# Patient Record
Sex: Male | Born: 1980 | Race: White | Hispanic: No | Marital: Single | Smoking: Current every day smoker
Health system: Southern US, Community
[De-identification: ages and names within clinical notes are randomized; demographics above are authoritative.]

## PROBLEM LIST (undated history)

## (undated) DIAGNOSIS — I10 Essential (primary) hypertension: Secondary | ICD-10-CM

---

## 2019-09-10 LAB — POCT LIPID PANEL
HDL: 61
LDL: 87
Non-HDL: 103
POC Glucose: 111 mg/dl — AB (ref 70–99)
TC/HDL: 2.7
TC: 163
TRG: 80

## 2019-09-20 ENCOUNTER — Other Ambulatory Visit: Payer: Self-pay

## 2019-09-20 DIAGNOSIS — Z008 Encounter for other general examination: Secondary | ICD-10-CM

## 2019-09-20 NOTE — Progress Notes (Signed)
     Patient ID: Shaun Heath, male    DOB: Nov 09, 1981, 38 y.o.   MRN: [id number]    Thank you!!  [place] Specialist [place]: [phone]  Cell:  [phone] Website: [email or web address]

## 2020-09-08 ENCOUNTER — Ambulatory Visit (INDEPENDENT_AMBULATORY_CARE_PROVIDER_SITE_OTHER): Payer: BC Managed Care – PPO

## 2020-09-08 ENCOUNTER — Other Ambulatory Visit: Payer: Self-pay

## 2020-09-08 ENCOUNTER — Ambulatory Visit
Admission: EM | Admit: 2020-09-08 | Discharge: 2020-09-08 | Disposition: A | Discharge status: Home / Self Care | Payer: BC Managed Care – PPO | Attending: Family Medicine | Admitting: Family Medicine

## 2020-09-08 DIAGNOSIS — R062 Wheezing: Secondary | ICD-10-CM | POA: Diagnosis not present

## 2020-09-08 DIAGNOSIS — J40 Bronchitis, not specified as acute or chronic: Secondary | ICD-10-CM

## 2020-09-08 DIAGNOSIS — R059 Cough, unspecified: Secondary | ICD-10-CM | POA: Insufficient documentation

## 2020-09-08 DIAGNOSIS — Z20822 Contact with and (suspected) exposure to covid-19: Secondary | ICD-10-CM | POA: Diagnosis not present

## 2020-09-08 DIAGNOSIS — I10 Essential (primary) hypertension: Secondary | ICD-10-CM | POA: Insufficient documentation

## 2020-09-08 DIAGNOSIS — Z79899 Other long term (current) drug therapy: Secondary | ICD-10-CM | POA: Diagnosis not present

## 2020-09-08 DIAGNOSIS — F17210 Nicotine dependence, cigarettes, uncomplicated: Secondary | ICD-10-CM | POA: Diagnosis not present

## 2020-09-08 HISTORY — DX: Essential (primary) hypertension: I10

## 2020-09-08 MED ORDER — BENZONATATE 200 MG PO CAPS: 200.0000 mg | ORAL_CAPSULE | Freq: Three times a day (TID) | ORAL | 0 refills | Status: DC | PRN

## 2020-09-08 MED ORDER — DOXYCYCLINE HYCLATE 100 MG PO CAPS: 100.0000 mg | ORAL_CAPSULE | Freq: Two times a day (BID) | ORAL | 0 refills | Status: DC

## 2020-09-08 NOTE — ED Provider Notes (Signed)
[facility]    CSN: [id number] Arrival date & time: 09/08/20  0813      History   Chief Complaint Chief Complaint  Patient presents with   Wheezing   Cough   HPI  39 year old male presents with the above complaints.  Patient reports ongoing cough, congestion, and wheezing.  Was recently seen by his primary care provider on 10/13.  Was given albuterol.  Patient reports that he seems to be worsening.  Has especially gotten worse since Wednesday of this week.  He reports congestion and productive cough.  Wheezing.  He believes he has a respiratory infection.  No fever.  No relieving factors.  Patient states that his cough is productive of discolored sputum.  No reported sick contacts.  No other associated symptoms.  Past Medical History:  Diagnosis Date   Hypertension    Home Medications    Prior to Admission medications   Medication Sig Start Date End Date Taking? Authorizing Provider  albuterol (VENTOLIN HFA) 108 (90 Base) MCG/ACT inhaler Inhale into the lungs. 08/28/20 08/28/21 Yes [provider]  hydrochlorothiazide (HYDRODIURIL) 25 MG tablet Take by mouth. 05/16/20 08/28/21 Yes [provider]  propranolol (INDERAL) 10 MG tablet Take by mouth. 05/16/20 05/16/21 Yes [provider]  benzonatate (TESSALON) 200 MG capsule Take 1 capsule (200 mg total) by mouth 3 (three) times daily as needed for cough. 09/08/20   Tommie Sams, DO  doxycycline (VIBRAMYCIN) 100 MG capsule Take 1 capsule (100 mg total) by mouth 2 (two) times daily. 09/08/20   Tommie Sams, DO    Family History History reviewed. No pertinent family history.  Social History Social History   Tobacco Use   Smoking status: Current Every Day Smoker    Packs/day: 0.10    Types: Cigarettes   Smokeless tobacco: Never Used  Vaping Use   Vaping Use: Never used  Substance Use Topics   Alcohol use: Yes    Comment: daily 2-3 beers   Drug use: Never     Allergies     Patient has no known allergies.   Review of Systems Review of Systems  Constitutional: Negative for fever.  HENT: Positive for congestion.   Respiratory: Positive for cough and wheezing.    Physical Exam Triage Vital Signs ED Triage Vitals  Enc Vitals Group     BP 09/08/20 0826 (!) 161/111     Pulse Rate 09/08/20 0826 83     Resp 09/08/20 0826 18     Temp 09/08/20 0826 98.3 F (36.8 C)     Temp Source 09/08/20 0826 Oral     SpO2 09/08/20 0826 96 %     Weight 09/08/20 0823 233 lb (105.7 kg)     Height 09/08/20 0823 5\' 6"  (1.676 m)     Head Circumference --      Peak Flow --      Pain Score 09/08/20 0826 2     Pain Loc --      Pain Edu? --      Excl. in GC? --    Updated Vital Signs BP (!) 161/111 (BP Location: Left Arm)    Pulse 83    Temp 98.3 F (36.8 C) (Oral)    Resp 18    Ht 5\' 6"  (1.676 m)    Wt 105.7 kg    SpO2 96%    BMI 37.61 kg/m   Visual Acuity Right Eye Distance:   Left Eye Distance:   Bilateral Distance:  Right Eye Near:   Left Eye Near:    Bilateral Near:     Physical Exam Vitals and nursing note reviewed.  Constitutional:      General: He is not in acute distress.    Appearance: Normal appearance. He is not ill-appearing.  HENT:     Head: Normocephalic and atraumatic.  Eyes:     General:        Right eye: No discharge.        Left eye: No discharge.     Conjunctiva/sclera: Conjunctivae normal.  Cardiovascular:     Rate and Rhythm: Normal rate and regular rhythm.  Pulmonary:     Effort: Pulmonary effort is normal.     Breath sounds: Wheezing present.  Neurological:     Mental Status: He is alert.  Psychiatric:        Mood and Affect: Mood normal.        Behavior: Behavior normal.    UC Treatments / Results  Labs (all labs ordered are listed, but only abnormal results are displayed) Labs Reviewed  SARS CORONAVIRUS 2 (TAT 6-24 HRS)    EKG  Radiology DG Chest 2 View  Result Date: 09/08/2020 CLINICAL DATA:  Cough, wheezing  EXAM: CHEST - 2 VIEW COMPARISON:  None. FINDINGS: The heart size and mediastinal contours are within normal limits. No focal airspace consolidation, pleural effusion, or pneumothorax. The visualized skeletal structures are unremarkable. IMPRESSION: No active cardiopulmonary disease. Electronically Signed   By: Duanne Guess D.O.   On: 09/08/2020 08:56   Procedures Procedures (including critical care time)  Medications Ordered in UC Medications - No data to display  Initial Impression / Assessment and Plan / UC Course  I have reviewed the triage vital signs and the nursing notes.  Pertinent labs & imaging results that were available during my care of the patient were reviewed by me and considered in my medical decision making (see chart for details).    39 year old male presents with bronchitis. Treating with Doxy, tessalon perles. Continue Albuterol.   Final Clinical Impressions(s) / UC Diagnoses   Final diagnoses:  Bronchitis   Discharge Instructions   None    ED Prescriptions    Medication Sig Dispense Auth. Provider   doxycycline (VIBRAMYCIN) 100 MG capsule Take 1 capsule (100 mg total) by mouth 2 (two) times daily. 14 capsule Khianna Blazina G, DO   benzonatate (TESSALON) 200 MG capsule Take 1 capsule (200 mg total) by mouth 3 (three) times daily as needed for cough. 30 capsule Tommie Sams, DO     PDMP not reviewed this encounter.   Everlene Other [place], [place] 09/08/20 [phone]

## 2020-09-08 NOTE — ED Triage Notes (Signed)
Pt states wheezing for past several weeks.  Seen by PCP and was given an inhaler. On Wednesday he started with sore throat and nasal congestion. Coughing up yellow phlegm.

## 2020-09-09 LAB — SARS CORONAVIRUS 2 (TAT 6-24 HRS): SARS Coronavirus 2: NEGATIVE

## 2021-03-19 ENCOUNTER — Other Ambulatory Visit: Payer: Self-pay

## 2021-03-19 ENCOUNTER — Encounter: Payer: Self-pay | Admitting: Emergency Medicine

## 2021-03-19 ENCOUNTER — Ambulatory Visit
Admission: EM | Admit: 2021-03-19 | Discharge: 2021-03-19 | Disposition: A | Discharge status: Home / Self Care | Payer: BC Managed Care – PPO | Attending: Physician Assistant | Admitting: Physician Assistant

## 2021-03-19 DIAGNOSIS — J069 Acute upper respiratory infection, unspecified: Secondary | ICD-10-CM | POA: Diagnosis not present

## 2021-03-19 DIAGNOSIS — F17210 Nicotine dependence, cigarettes, uncomplicated: Secondary | ICD-10-CM | POA: Insufficient documentation

## 2021-03-19 DIAGNOSIS — Z20822 Contact with and (suspected) exposure to covid-19: Secondary | ICD-10-CM | POA: Diagnosis not present

## 2021-03-19 DIAGNOSIS — J029 Acute pharyngitis, unspecified: Secondary | ICD-10-CM | POA: Insufficient documentation

## 2021-03-19 DIAGNOSIS — R0981 Nasal congestion: Secondary | ICD-10-CM

## 2021-03-19 DIAGNOSIS — R059 Cough, unspecified: Secondary | ICD-10-CM

## 2021-03-19 LAB — GROUP A STREP BY PCR: Group A Strep by PCR: NOT DETECTED

## 2021-03-19 MED ORDER — PROMETHAZINE-DM 6.25-15 MG/5ML PO SYRP: 5.0000 mL | ORAL_SOLUTION | Freq: Four times a day (QID) | ORAL | 0 refills | Status: DC | PRN

## 2021-03-19 NOTE — ED Provider Notes (Signed)
[facility]    CSN: [id number] Arrival date & time: 03/19/21  1208      History   Chief Complaint Chief Complaint  Patient presents with  . Cough  . Sore Throat    HPI Shaun Heath is a 40 y.o. male presenting for 3-day history of productive cough, nasal congestion, sore throat and postnasal drainage.  He denies any fever, fatigue, body aches, sinus pain, ear pain, chest pain, shortness of breath, abdominal pain, nausea/vomiting or diarrhea.  No sick contacts and no known exposure to COVID-19.  Patient's been taking over-the-counter Mucinex and using Flonase.  No other concerns.  HPI  Past Medical History:  Diagnosis Date  . Hypertension     There are no problems to display for this patient.   History reviewed. No pertinent surgical history.     Home Medications    Prior to Admission medications   Medication Sig Start Date End Date Taking? Authorizing Provider  azelastine (ASTELIN) 0.1 % nasal spray 1 spray into each nostril Two (2) times a day. 05/30/20  Yes [provider]  promethazine-dextromethorphan (PROMETHAZINE-DM) 6.25-15 MG/5ML syrup Take 5 mLs by mouth 4 (four) times daily as needed for cough. 03/19/21  Yes Eusebio Friendly B, PA-C  albuterol (VENTOLIN HFA) 108 (90 Base) MCG/ACT inhaler Inhale into the lungs. 08/28/20 08/28/21  [provider]  hydrochlorothiazide (HYDRODIURIL) 25 MG tablet Take by mouth. 05/16/20 08/28/21  [provider]  propranolol (INDERAL) 10 MG tablet Take by mouth. 05/16/20 05/16/21  [provider]    Family History History reviewed. No pertinent family history.  Social History Social History   Tobacco Use  . Smoking status: Current Every Day Smoker    Packs/day: 0.10    Types: Cigarettes  . Smokeless tobacco: Never Used  Vaping Use  . Vaping Use: Never used  Substance Use Topics  . Alcohol use: Yes    Comment: daily 2-3 beers  . Drug use: Never     Allergies   Patient  has no known allergies.   Review of Systems Review of Systems  Constitutional: Negative for fatigue and fever.  HENT: Positive for congestion and sore throat. Negative for rhinorrhea, sinus pressure and sinus pain.   Respiratory: Positive for cough. Negative for shortness of breath and wheezing.   Cardiovascular: Negative for chest pain.  Gastrointestinal: Negative for abdominal pain, diarrhea, nausea and vomiting.  Musculoskeletal: Negative for myalgias.  Neurological: Negative for weakness, light-headedness and headaches.  Hematological: Negative for adenopathy.     Physical Exam Triage Vital Signs ED Triage Vitals  Enc Vitals Group     BP 03/19/21 1243 (!) 165/100     Pulse Rate 03/19/21 1243 86     Resp 03/19/21 1243 18     Temp 03/19/21 1243 98.8 F (37.1 C)     Temp Source 03/19/21 1243 Oral     SpO2 03/19/21 1243 97 %     Weight --      Height --      Head Circumference --      Peak Flow --      Pain Score 03/19/21 1241 3     Pain Loc --      Pain Edu? --      Excl. in GC? --    No data found.  Updated Vital Signs BP (!) 165/100 (BP Location: Left Arm)   Pulse 86   Temp 98.8 F (37.1 C) (Oral)   Resp 18   SpO2  97%       Physical Exam Vitals and nursing note reviewed.  Constitutional:      General: He is not in acute distress.    Appearance: Normal appearance. He is well-developed. He is not ill-appearing or diaphoretic.  HENT:     Head: Normocephalic and atraumatic.     Right Ear: Tympanic membrane, ear canal and external ear normal.     Left Ear: Tympanic membrane, ear canal and external ear normal.     Nose: Congestion and rhinorrhea present.     Mouth/Throat:     Mouth: Mucous membranes are moist.     Pharynx: Oropharynx is clear. Uvula midline. Posterior oropharyngeal erythema present. No oropharyngeal exudate.     Tonsils: No tonsillar abscesses.  Eyes:     General: No scleral icterus.       Right eye: No discharge.        Left eye: No  discharge.     Conjunctiva/sclera: Conjunctivae normal.  Neck:     Thyroid: No thyromegaly.     Trachea: No tracheal deviation.  Cardiovascular:     Rate and Rhythm: Normal rate and regular rhythm.     Heart sounds: Normal heart sounds.  Pulmonary:     Effort: Pulmonary effort is normal. No respiratory distress.     Breath sounds: No wheezing, rhonchi or rales.  Musculoskeletal:     Cervical back: Neck supple.  Lymphadenopathy:     Cervical: No cervical adenopathy.  Skin:    General: Skin is warm and dry.     Findings: No rash.  Neurological:     General: No focal deficit present.     Mental Status: He is alert. Mental status is at baseline.     Motor: No weakness.     Gait: Gait normal.  Psychiatric:        Mood and Affect: Mood normal.        Behavior: Behavior normal.        Thought Content: Thought content normal.      UC Treatments / Results  Labs (all labs ordered are listed, but only abnormal results are displayed) Labs Reviewed  GROUP A STREP BY PCR  SARS CORONAVIRUS 2 (TAT 6-24 HRS)    EKG   Radiology No results found.  Procedures Procedures (including critical care time)  Medications Ordered in UC Medications - No data to display  Initial Impression / Assessment and Plan / UC Course  I have reviewed the triage vital signs and the nursing notes.  Pertinent labs & imaging results that were available during my care of the patient were reviewed by me and considered in my medical decision making (see chart for details).   40 year old male presenting for 3-day history of cough, congestion and sore throat.  He is afebrile.  He is overall well-appearing.  He does have nasal congestion and mild rhinorrhea.  Mild posterior pharyngeal erythema.  His chest is clear to auscultation and heart regular rate and rhythm.  Molecular strep negative. COVID obtained. Current CDC guidelines, isolation protocol and ED precautions.   Clinical presentation consistent  with URI.  Advised patient symptoms consistent with viral infection and antibiotics not indicated at this time.  Advised that he should follow back up though if he develops any increased sinus pain, ear pain, worsening cough, chest pain, breathing difficulty, fevers or is not better after another week.  I did send Promethazine DM and encouraged him to increase rest and fluids and continue with the  nasal spray.  Work note given for the next couple days.   Final Clinical Impressions(s) / UC Diagnoses   Final diagnoses:  Viral upper respiratory tract infection  Sore throat  Cough  Nasal congestion     Discharge Instructions     URI/COLD SYMPTOMS: Strep is negative. Your exam today is consistent with a viral illness. Antibiotics are not indicated at this time. Use medications as directed, including cough syrup, nasal saline, and decongestants. Your symptoms should improve over the next few days and resolve within 7-10 days. Increase rest and fluids. F/u if symptoms worsen or predominate such as sore throat, ear pain, productive cough, shortness of breath, or if you develop high fevers or worsening fatigue over the next several days.    You have received COVID testing today either for positive exposure, concerning symptoms that could be related to COVID infection, screening purposes, or re-testing after confirmed positive.  Your test obtained today checks for active viral infection in the last 1-2 weeks. If your test is negative now, you can still test positive later. So, if you do develop symptoms you should either get re-tested and/or isolate x 5 days and then strict mask use x 5 days (unvaccinated) or mask use x 10 days (vaccinated). Please follow CDC guidelines.  While Rapid antigen tests come back in 15-20 minutes, send out PCR/molecular test results typically come back within 1-3 days. In the mean time, if you are symptomatic, assume this could be a positive test and treat/monitor yourself as if  you do have COVID.   We will call with test results if positive. Please download the MyChart app and set up a profile to access test results.   If symptomatic, go home and rest. Push fluids. Take Tylenol as needed for discomfort. Gargle warm salt water. Throat lozenges. Take Mucinex DM or Robitussin for cough. Humidifier in bedroom to ease coughing. Warm showers. Also review the COVID handout for more information.  COVID-19 INFECTION: The incubation period of COVID-19 is approximately 14 days after exposure, with most symptoms developing in roughly 4-5 days. Symptoms may range in severity from mild to critically severe. Roughly 80% of those infected will have mild symptoms. People of any age may become infected with COVID-19 and have the ability to transmit the virus. The most common symptoms include: fever, fatigue, cough, body aches, headaches, sore throat, nasal congestion, shortness of breath, nausea, vomiting, diarrhea, changes in smell and/or taste.    COURSE OF ILLNESS Some patients may begin with mild disease which can progress quickly into critical symptoms. If your symptoms are worsening please call ahead to the Emergency Department and proceed there for further treatment. Recovery time appears to be roughly 1-2 weeks for mild symptoms and 3-6 weeks for severe disease.   GO IMMEDIATELY TO ER FOR FEVER YOU ARE UNABLE TO GET DOWN WITH TYLENOL, BREATHING PROBLEMS, CHEST PAIN, FATIGUE, LETHARGY, INABILITY TO EAT OR DRINK, ETC  QUARANTINE AND ISOLATION: To help decrease the spread of COVID-19 please remain isolated if you have COVID infection or are highly suspected to have COVID infection. This means -stay home and isolate to one room in the home if you live with others. Do not share a bed or bathroom with others while ill, sanitize and wipe down all countertops and keep common areas clean and disinfected. Stay home for 5 days. If you have no symptoms or your symptoms are resolving after 5 days,  you can leave your house. Continue to wear a mask around  others for 5 additional days. If you have been in close contact (within 6 feet) of someone diagnosed with COVID 19, you are advised to quarantine in your home for 14 days as symptoms can develop anywhere from 2-14 days after exposure to the virus. If you develop symptoms, you  must isolate.  Most current guidelines for COVID after exposure -unvaccinated: isolate 5 days and strict mask use x 5 days. Test on day 5 is possible -vaccinated: wear mask x 10 days if symptoms do not develop -You do not necessarily need to be tested for COVID if you have + exposure and  develop symptoms. Just isolate at home x10 days from symptom onset During this global pandemic, CDC advises to practice social distancing, try to stay at least 156ft away from others at all times. Wear a face covering. Wash and sanitize your hands regularly and avoid going anywhere that is not necessary.  KEEP IN MIND THAT THE COVID TEST IS NOT 100% ACCURATE AND YOU SHOULD STILL DO EVERYTHING TO PREVENT POTENTIAL SPREAD OF VIRUS TO OTHERS (WEAR MASK, WEAR GLOVES, WASH HANDS AND SANITIZE REGULARLY). IF INITIAL TEST IS NEGATIVE, THIS MAY NOT MEAN YOU ARE DEFINITELY NEGATIVE. MOST ACCURATE TESTING IS DONE 5-7 DAYS AFTER EXPOSURE.   It is not advised by CDC to get re-tested after receiving a positive COVID test since you can still test positive for weeks to months after you have already cleared the virus.   *If you have not been vaccinated for COVID, I strongly suggest you consider getting vaccinated as long as there are no contraindications.      ED Prescriptions    Medication Sig Dispense Auth. Provider   promethazine-dextromethorphan (PROMETHAZINE-DM) 6.25-15 MG/5ML syrup Take 5 mLs by mouth 4 (four) times daily as needed for cough. 118 mL Shirlee LatchEaves, Chantell Kunkler B, PA-C     PDMP not reviewed this encounter.   Shirlee Latchaves, Titania Gault B, PA-C 03/19/21 1341

## 2021-03-19 NOTE — Discharge Instructions (Signed)
URI/COLD SYMPTOMS: Strep is negative. Your exam today is consistent with a viral illness. Antibiotics are not indicated at this time. Use medications as directed, including cough syrup, nasal saline, and decongestants. Your symptoms should improve over the next few days and resolve within 7-10 days. Increase rest and fluids. F/u if symptoms worsen or predominate such as sore throat, ear pain, productive cough, shortness of breath, or if you develop high fevers or worsening fatigue over the next several days.    You have received COVID testing today either for positive exposure, concerning symptoms that could be related to COVID infection, screening purposes, or re-testing after confirmed positive.  Your test obtained today checks for active viral infection in the last 1-2 weeks. If your test is negative now, you can still test positive later. So, if you do develop symptoms you should either get re-tested and/or isolate x 5 days and then strict mask use x 5 days (unvaccinated) or mask use x 10 days (vaccinated). Please follow CDC guidelines.  While Rapid antigen tests come back in 15-20 minutes, send out PCR/molecular test results typically come back within 1-3 days. In the mean time, if you are symptomatic, assume this could be a positive test and treat/monitor yourself as if you do have COVID.   We will call with test results if positive. Please download the MyChart app and set up a profile to access test results.   If symptomatic, go home and rest. Push fluids. Take Tylenol as needed for discomfort. Gargle warm salt water. Throat lozenges. Take Mucinex DM or Robitussin for cough. Humidifier in bedroom to ease coughing. Warm showers. Also review the COVID handout for more information.  COVID-19 INFECTION: The incubation period of COVID-19 is approximately 14 days after exposure, with most symptoms developing in roughly 4-5 days. Symptoms may range in severity from mild to critically severe. Roughly 80%  of those infected will have mild symptoms. People of any age may become infected with COVID-19 and have the ability to transmit the virus. The most common symptoms include: fever, fatigue, cough, body aches, headaches, sore throat, nasal congestion, shortness of breath, nausea, vomiting, diarrhea, changes in smell and/or taste.    COURSE OF ILLNESS Some patients may begin with mild disease which can progress quickly into critical symptoms. If your symptoms are worsening please call ahead to the Emergency Department and proceed there for further treatment. Recovery time appears to be roughly 1-2 weeks for mild symptoms and 3-6 weeks for severe disease.   GO IMMEDIATELY TO ER FOR FEVER YOU ARE UNABLE TO GET DOWN WITH TYLENOL, BREATHING PROBLEMS, CHEST PAIN, FATIGUE, LETHARGY, INABILITY TO EAT OR DRINK, ETC  QUARANTINE AND ISOLATION: To help decrease the spread of COVID-19 please remain isolated if you have COVID infection or are highly suspected to have COVID infection. This means -stay home and isolate to one room in the home if you live with others. Do not share a bed or bathroom with others while ill, sanitize and wipe down all countertops and keep common areas clean and disinfected. Stay home for 5 days. If you have no symptoms or your symptoms are resolving after 5 days, you can leave your house. Continue to wear a mask around others for 5 additional days. If you have been in close contact (within 6 feet) of someone diagnosed with COVID 19, you are advised to quarantine in your home for 14 days as symptoms can develop anywhere from 2-14 days after exposure to the virus. If you develop  symptoms, you  must isolate.  Most current guidelines for COVID after exposure -unvaccinated: isolate 5 days and strict mask use x 5 days. Test on day 5 is possible -vaccinated: wear mask x 10 days if symptoms do not develop -You do not necessarily need to be tested for COVID if you have + exposure and  develop symptoms.  Just isolate at home x10 days from symptom onset During this global pandemic, CDC advises to practice social distancing, try to stay at least 6ft away from others at all times. Wear a face covering. Wash and sanitize your hands regularly and avoid going anywhere that is not necessary.  KEEP IN MIND THAT THE COVID TEST IS NOT 100% ACCURATE AND YOU SHOULD STILL DO EVERYTHING TO PREVENT POTENTIAL SPREAD OF VIRUS TO OTHERS (WEAR MASK, WEAR GLOVES, WASH HANDS AND SANITIZE REGULARLY). IF INITIAL TEST IS NEGATIVE, THIS MAY NOT MEAN YOU ARE DEFINITELY NEGATIVE. MOST ACCURATE TESTING IS DONE 5-7 DAYS AFTER EXPOSURE.   It is not advised by CDC to get re-tested after receiving a positive COVID test since you can still test positive for weeks to months after you have already cleared the virus.   *If you have not been vaccinated for COVID, I strongly suggest you consider getting vaccinated as long as there are no contraindications.   

## 2021-03-19 NOTE — ED Triage Notes (Signed)
Pt is present today with a cough and sore throat. Pt states his sx started Sunday.

## 2021-03-20 LAB — SARS CORONAVIRUS 2 (TAT 6-24 HRS): SARS Coronavirus 2: NEGATIVE

## 2021-09-27 ENCOUNTER — Ambulatory Visit
Admission: EM | Admit: 2021-09-27 | Discharge: 2021-09-27 | Disposition: A | Discharge status: Home / Self Care | Payer: BC Managed Care – PPO | Attending: Emergency Medicine | Admitting: Emergency Medicine

## 2021-09-27 ENCOUNTER — Encounter: Payer: Self-pay | Admitting: Emergency Medicine

## 2021-09-27 ENCOUNTER — Other Ambulatory Visit: Payer: Self-pay

## 2021-09-27 DIAGNOSIS — I10 Essential (primary) hypertension: Secondary | ICD-10-CM | POA: Diagnosis not present

## 2021-09-27 DIAGNOSIS — J019 Acute sinusitis, unspecified: Secondary | ICD-10-CM

## 2021-09-27 MED ORDER — BENZONATATE 200 MG PO CAPS: 200.0000 mg | ORAL_CAPSULE | Freq: Three times a day (TID) | ORAL | 0 refills | Status: DC | PRN

## 2021-09-27 MED ORDER — PREDNISONE 20 MG PO TABS: 40.0000 mg | ORAL_TABLET | Freq: Every day | ORAL | 0 refills | Status: AC

## 2021-09-27 MED ORDER — AMOXICILLIN-POT CLAVULANATE 875-125 MG PO TABS: 1.0000 | ORAL_TABLET | Freq: Two times a day (BID) | ORAL | 0 refills | Status: DC

## 2021-09-27 MED ORDER — AEROCHAMBER PLUS MISC: 2 refills | Status: AC

## 2021-09-27 NOTE — ED Provider Notes (Signed)
HPI  SUBJECTIVE:  Shaun Heath is a 40 y.o. male who presents with 6 days of cough.  It has become productive of yellowish-green mucus starting 2 days ago.  He reports sore throat secondary to the cough, occasional wheezing and postnasal drip.  No fevers, nasal congestion, rhinorrhea, sinus pain or pressure, facial swelling, dental pain, shortness of breath, bodyaches, headaches, nausea, vomiting, diarrhea, abdominal pain, allergy symptoms.  No COVID or flu exposure.  He got the first dose of the COVID-vaccine.  He did not get the flu vaccine.  No antibiotics in the past month.  No antipyretic in the past 6 hours.  He has been taking Mucinex, Flonase and Astelin with improvement in his symptoms.  No aggravating factors.  He states is identical to previous episodes of sinusitis.  No recent decongestions.  He has a past medical history of hypertension and states that he is compliant with his medications.  He states his blood pressure is normally elevated in this range.  He is also a smoker.  No history of diabetes, asthma.  PMD: [facility] [place]    Past Medical History:  Diagnosis Date   Hypertension     History reviewed. No pertinent surgical history.  History reviewed. No pertinent family history.  Social History   Tobacco Use   Smoking status: Every Day    Packs/day: 0.10    Types: Cigarettes   Smokeless tobacco: Never  Vaping Use   Vaping Use: Never used  Substance Use Topics   Alcohol use: Yes    Comment: daily 2-3 beers   Drug use: Never    No current facility-administered medications for this encounter.  Current Outpatient Medications:    amoxicillin-clavulanate (AUGMENTIN) 875-125 MG tablet, Take 1 tablet by mouth 2 (two) times daily. X 7 days, Disp: 14 tablet, Rfl: 0   benzonatate (TESSALON) 200 MG capsule, Take 1 capsule (200 mg total) by mouth 3 (three) times daily as needed for cough., Disp: 30 capsule, Rfl: 0   predniSONE (DELTASONE) 20 MG tablet, Take 2  tablets (40 mg total) by mouth daily with breakfast for 5 days., Disp: 10 tablet, Rfl: 0   Spacer/Aero-Holding Chambers (AEROCHAMBER PLUS) inhaler, Use with inhaler, Disp: 1 each, Rfl: 2   albuterol (VENTOLIN HFA) 108 (90 Base) MCG/ACT inhaler, Inhale into the lungs., Disp: , Rfl:    azelastine (ASTELIN) 0.1 % nasal spray, 1 spray into each nostril Two (2) times a day., Disp: , Rfl:    hydrochlorothiazide (HYDRODIURIL) 25 MG tablet, Take by mouth., Disp: , Rfl:    propranolol (INDERAL) 10 MG tablet, Take by mouth., Disp: , Rfl:   No Known Allergies   ROS  As noted in HPI.   Physical Exam  BP (!) 163/118 (BP Location: Left Arm)   Pulse 91   Temp 98.4 F (36.9 C)   Resp 16   Ht 5\' 6"  (1.676 m)   Wt 105.2 kg   SpO2 98%   BMI 37.45 kg/m   Constitutional: Well developed, well nourished, no acute distress Eyes:  EOMI, conjunctiva normal bilaterally HENT: Normocephalic, atraumatic,mucus membranes moist.  Mucoid nasal congestion.  Normal turbinates.  No maxillary, frontal sinus tenderness.  Positive cobblestoning.  No obvious postnasal drip Respiratory: Normal inspiratory effort, diffuse expiratory wheezing throughout all lung fields Cardiovascular: Normal rate, regular rhythm, no murmurs rubs or gallops GI: nondistended skin: No rash, skin intact Musculoskeletal: no deformities Neurologic: Alert & oriented x 3, no focal neuro deficits Psychiatric: Speech and behavior  appropriate   ED Course   Medications - No data to display  No orders of the defined types were placed in this encounter.   No results found for this or any previous visit (from the past 24 hour(s)). No results found.  ED Clinical Impression  1. Acute non-recurrent sinusitis, unspecified location   2. Essential hypertension      ED Assessment/Plan  1.  Sinusitis/cough.  Saline nasal irrigation, regularly scheduled albuterol with a spacer, 40 mg of prednisone for 5 days, Tessalon, continue Mucinex,  Astelin Flonase.  Wait-and-see prescription of Augmentin.  Went over indications for starting this.  2.  Hypertension.  Patient is completely asymptomatic.  States that he feels fine.  Advised that he needs to follow-up with his PMD to get it under better control.  ER return precautions given  Discussed MDM, treatment plan, and plan for follow-up with patient. Discussed sn/sx that should prompt return to the ED. patient agrees with plan.   Meds ordered this encounter  Medications   amoxicillin-clavulanate (AUGMENTIN) 875-125 MG tablet    Sig: Take 1 tablet by mouth 2 (two) times daily. X 7 days    Dispense:  14 tablet    Refill:  0   Spacer/Aero-Holding Chambers (AEROCHAMBER PLUS) inhaler    Sig: Use with inhaler    Dispense:  1 each    Refill:  2    Please educate patient on use   predniSONE (DELTASONE) 20 MG tablet    Sig: Take 2 tablets (40 mg total) by mouth daily with breakfast for 5 days.    Dispense:  10 tablet    Refill:  0   benzonatate (TESSALON) 200 MG capsule    Sig: Take 1 capsule (200 mg total) by mouth 3 (three) times daily as needed for cough.    Dispense:  30 capsule    Refill:  0      *This clinic note was created using Scientist, clinical (histocompatibility and immunogenetics). Therefore, there may be occasional mistakes despite careful proofreading.  ?    Domenick Gong, MD 09/29/21 [phone]

## 2021-09-27 NOTE — ED Triage Notes (Signed)
Pt c/o coughing green mucous, using mucinex.  Sxs started 5 days ago. Sob during coughing fit. No fever

## 2021-09-27 NOTE — Discharge Instructions (Signed)
regularly scheduled albuterol with a spacer for the next several days.  2 puffs every 4 hours for 2 days, then every 6 hours for 2 days, then as needed.  May back off on the albuterol if you start to feel better sooner., 40 mg of prednisone for 5 days-may discontinue this if it makes you feel bad, Tessalon, continue Mucinex, Astelin Flonase.  Wait-and-see prescription of Augmentin.  I would wait another 4 to 5 days before filling this.  Decrease your salt intake. diet and exercise will lower your blood pressure significantly. It is important to keep your blood pressure under good control, as having a elevated blood pressure for prolonged periods of time significantly increases your risk of stroke, heart attacks, kidney damage, eye damage, and other problems. Measure your blood pressure once a day, preferably at the same time every day. Keep a log of this and bring it to your next doctor's appointment.  Bring your blood pressure cuff as well.  Return here in 2 weeks for blood pressure recheck if you're unable to find a primary care physician by then. Return immediately to the ER if you start having chest pain, headache, problems seeing, problems talking, problems walking, if you feel like you're about to pass out, if you do pass out, if you have a seizure, or for any other concerns.  Go to www.goodrx.com  or www.costplusdrugs.com to look up your medications. This will give you a list of where you can find your prescriptions at the most affordable prices. Or ask the pharmacist what the cash price is, or if they have any other discount programs available to help make your medication more affordable. This can be less expensive than what you would pay with insurance.

## 2021-12-31 ENCOUNTER — Other Ambulatory Visit: Payer: Self-pay

## 2021-12-31 ENCOUNTER — Ambulatory Visit
Admission: EM | Admit: 2021-12-31 | Discharge: 2021-12-31 | Disposition: A | Discharge status: Home / Self Care | Payer: BC Managed Care – PPO | Attending: Emergency Medicine | Admitting: Emergency Medicine

## 2021-12-31 DIAGNOSIS — J0111 Acute recurrent frontal sinusitis: Secondary | ICD-10-CM

## 2021-12-31 DIAGNOSIS — R509 Fever, unspecified: Secondary | ICD-10-CM

## 2021-12-31 DIAGNOSIS — J069 Acute upper respiratory infection, unspecified: Secondary | ICD-10-CM

## 2021-12-31 DIAGNOSIS — R197 Diarrhea, unspecified: Secondary | ICD-10-CM | POA: Diagnosis not present

## 2021-12-31 MED ORDER — AZELASTINE HCL 0.1 % NA SOLN: NASAL | 0 refills | Status: AC

## 2021-12-31 MED ORDER — BENZONATATE 200 MG PO CAPS: 200.0000 mg | ORAL_CAPSULE | Freq: Three times a day (TID) | ORAL | 0 refills | Status: AC | PRN

## 2021-12-31 MED ORDER — AMOXICILLIN-POT CLAVULANATE 875-125 MG PO TABS: 1.0000 | ORAL_TABLET | Freq: Two times a day (BID) | ORAL | 0 refills | Status: DC

## 2021-12-31 NOTE — ED Provider Notes (Signed)
[facility]    CSN: [id number] Arrival date & time: 12/31/21  6160      History   Chief Complaint Chief Complaint  Patient presents with   Cough    HPI Shaun Heath is a 41 y.o. male.   Patient presents today with cough congestion some diarrhea and sore throat for 5 days.  Patient states that the entire household has the same thing for him first he thought it was viral.  His symptoms seem to not be going away.  Patient states he had the same symptoms back in November and was given amoxicillin and would like to same treatment.  Patient also states that he has had a low-grade fever took Tylenol and Mucinex last night with minimal relief.  Patient does take 2 nasal sprays with a slight relief and is asking for a refill on them.  Denies any chest pain no shortness of breath.   Past Medical History:  Diagnosis Date   Hypertension     There are no problems to display for this patient.   History reviewed. No pertinent surgical history.     Home Medications    Prior to Admission medications   Medication Sig Start Date End Date Taking? Authorizing Provider  albuterol (VENTOLIN HFA) 108 (90 Base) MCG/ACT inhaler Inhale into the lungs. 08/28/20 12/31/21 Yes [provider]  azelastine (ASTELIN) 0.1 % nasal spray 1 spray into each nostril Two (2) times a day. 05/30/20  Yes [provider]  hydrochlorothiazide (HYDRODIURIL) 25 MG tablet Take by mouth. 05/16/20 12/31/21 Yes [provider]  propranolol (INDERAL) 10 MG tablet Take by mouth. 05/16/20 12/31/21 Yes [provider]  amoxicillin-clavulanate (AUGMENTIN) 875-125 MG tablet Take 1 tablet by mouth 2 (two) times daily. X 7 days 09/27/21   Domenick Gong, MD  benzonatate (TESSALON) 200 MG capsule Take 1 capsule (200 mg total) by mouth 3 (three) times daily as needed for cough. 09/27/21   Domenick Gong, MD  Spacer/Aero-Holding Chambers (AEROCHAMBER PLUS) inhaler Use with inhaler  09/27/21   Domenick Gong, MD    Family History History reviewed. No pertinent family history.  Social History Social History   Tobacco Use   Smoking status: Every Day    Packs/day: 0.10    Types: Cigarettes   Smokeless tobacco: Never  Vaping Use   Vaping Use: Never used  Substance Use Topics   Alcohol use: Yes    Comment: daily 2-3 beers   Drug use: Never     Allergies   Patient has no known allergies.   Review of Systems Review of Systems  Constitutional:  Positive for fever. Negative for activity change, appetite change, chills and fatigue.  HENT:  Positive for congestion, postnasal drip, sinus pressure, sinus pain, sneezing and sore throat.   Eyes: Negative.   Respiratory:  Positive for cough. Negative for shortness of breath and wheezing.   Cardiovascular: Negative.   Gastrointestinal:  Positive for diarrhea. Negative for abdominal pain, nausea and vomiting.  Genitourinary: Negative.   Musculoskeletal: Negative.   Skin: Negative.  Negative for rash.  Neurological: Negative.  Negative for dizziness and headaches.    Physical Exam Triage Vital Signs ED Triage Vitals  Enc Vitals Group     BP 12/31/21 0822 (!) 138/94     Pulse Rate 12/31/21 0822 96     Resp 12/31/21 0822 18     Temp 12/31/21 0822 99.5 F (37.5 C)     Temp Source 12/31/21 7371 Oral  SpO2 12/31/21 0822 96 %     Weight 12/31/21 0821 236 lb (107 kg)     Height 12/31/21 0821 5\' 7"  (1.702 m)     Head Circumference --      Peak Flow --      Pain Score 12/31/21 0820 0     Pain Loc --      Pain Edu? --      Excl. in [place]? --    No data found.  Updated Vital Signs BP (!) 138/94 (BP Location: Left Arm)    Pulse 96    Temp 99.5 F (37.5 C) (Oral)    Resp 18    Ht 5\' 7"  (1.702 m)    Wt 236 lb (107 kg)    SpO2 96%    BMI 36.96 kg/m   Visual Acuity Right Eye Distance:   Left Eye Distance:   Bilateral Distance:    Right Eye Near:   Left Eye Near:    Bilateral Near:     Physical  Exam Constitutional:      Appearance: Normal appearance. He is obese.  HENT:     Right Ear: Tympanic membrane normal.     Left Ear: Tympanic membrane normal.     Nose: Congestion present.     Mouth/Throat:     Mouth: Mucous membranes are moist.     Pharynx: Posterior oropharyngeal erythema present.  Eyes:     Pupils: Pupils are equal, round, and reactive to light.  Cardiovascular:     Rate and Rhythm: Normal rate.  Pulmonary:     Effort: Pulmonary effort is normal.     Breath sounds: Normal breath sounds.  Abdominal:     General: Abdomen is flat. Bowel sounds are normal.  Musculoskeletal:        General: Normal range of motion.     Cervical back: Normal range of motion.  Skin:    General: Skin is warm.     Capillary Refill: Capillary refill takes less than 2 seconds.  Neurological:     General: No focal deficit present.     Mental Status: He is alert.     UC Treatments / Results  Labs (all labs ordered are listed, but only abnormal results are displayed) Labs Reviewed - No data to display  EKG   Radiology No results found.  Procedures Procedures (including critical care time)  Medications Ordered in UC Medications - No data to display  Initial Impression / Assessment and Plan / UC Course  I have reviewed the triage vital signs and the nursing notes.  Pertinent labs & imaging results that were available during my care of the patient were reviewed by me and considered in my medical decision making (see chart for details).     Continue to use your nasal sprays as needed Take cough medicine Tylenol or Motrin as needed Use a humidifier at night to decrease with cough Stay hydrated well with clear fluids If symptoms become worse we would need to return or see your PCP Take Claritin daily Final Clinical Impressions(s) / UC Diagnoses   Final diagnoses:  None   Discharge Instructions   None    ED Prescriptions   None    PDMP not reviewed this  encounter.   Marney Setting, NP 12/31/21 [phone]

## 2021-12-31 NOTE — ED Triage Notes (Signed)
Pt here with C/O cough and green phlegm. States he can taste infection when he coughs phlegm up. For 5 days.

## 2022-01-05 ENCOUNTER — Ambulatory Visit (INDEPENDENT_AMBULATORY_CARE_PROVIDER_SITE_OTHER): Payer: BC Managed Care – PPO

## 2022-01-05 ENCOUNTER — Other Ambulatory Visit: Payer: Self-pay

## 2022-01-05 ENCOUNTER — Ambulatory Visit
Admission: EM | Admit: 2022-01-05 | Discharge: 2022-01-05 | Disposition: A | Discharge status: Home / Self Care | Payer: BC Managed Care – PPO | Attending: Emergency Medicine | Admitting: Emergency Medicine

## 2022-01-05 DIAGNOSIS — M25532 Pain in left wrist: Secondary | ICD-10-CM | POA: Diagnosis not present

## 2022-01-05 MED ORDER — PREDNISONE 10 MG (21) PO TBPK: ORAL_TABLET | ORAL | 0 refills | Status: AC

## 2022-01-05 NOTE — ED Provider Notes (Signed)
[facility]    CSN: [id number] Arrival date & time: 01/05/22  0831      History   Chief Complaint Chief Complaint  Patient presents with   Wrist Pain    HPI Zacharey Jensen is a 41 y.o. male.   HPI  41 year old male here for evaluation of left wrist pain.  Patient reports that he had initial wrist injury approximately a year ago.  He states that he was at his workstation and he spent a lot of time leaning on his left wrist.  This caused pain and inflammation of his wrist that eventually resolved on its own.  He was never evaluated at that time.  He reports that he had a flare a month ago that lasted for several days and then he developed acute pain 2 days ago after trying to open a jar.  His pain is located only in his wrist and the wrist is swollen.  He has been wearing a brace on his wrist.  He states that he has significant amount of pain and limited range of motion due to the pain and swelling.  He denies any numbness or tingling in his fingers.  Past Medical History:  Diagnosis Date   Hypertension     There are no problems to display for this patient.   History reviewed. No pertinent surgical history.     Home Medications    Prior to Admission medications   Medication Sig Start Date End Date Taking? Authorizing Provider  azelastine (ASTELIN) 0.1 % nasal spray 1 spray into each nostril Two (2) times a day. 12/31/21  Yes Coralyn Mark, NP  benzonatate (TESSALON) 200 MG capsule Take 1 capsule (200 mg total) by mouth 3 (three) times daily as needed for cough. 12/31/21  Yes Coralyn Mark, NP  hydrochlorothiazide (HYDRODIURIL) 25 MG tablet Take by mouth. 05/16/20 01/05/22 Yes [provider]  predniSONE (STERAPRED UNI-PAK 21 TAB) 10 MG (21) TBPK tablet Take 6 tablets on day 1, 5 tablets day 2, 4 tablets day 3, 3 tablets day 4, 2 tablets day 5, 1 tablet day 6 01/05/22  Yes Becky Augusta, NP  propranolol (INDERAL) 10 MG tablet Take by mouth.  05/16/20 01/05/22 Yes [provider]  Spacer/Aero-Holding Chambers (AEROCHAMBER PLUS) inhaler Use with inhaler 09/27/21  Yes Domenick Gong, MD  albuterol (VENTOLIN HFA) 108 (90 Base) MCG/ACT inhaler Inhale into the lungs. 08/28/20 12/31/21  [provider]    Family History History reviewed. No pertinent family history.  Social History Social History   Tobacco Use   Smoking status: Every Day    Packs/day: 0.10    Types: Cigarettes   Smokeless tobacco: Never  Vaping Use   Vaping Use: Never used  Substance Use Topics   Alcohol use: Yes    Comment: daily 2-3 beers   Drug use: Never     Allergies   Patient has no known allergies.   Review of Systems Review of Systems  Constitutional:  Negative for fever.  Musculoskeletal:  Positive for arthralgias, joint swelling and myalgias.  Neurological:  Positive for weakness. Negative for numbness.  Hematological: Negative.     Physical Exam Triage Vital Signs ED Triage Vitals  Enc Vitals Group     BP 01/05/22 0906 (!) 146/100     Pulse Rate 01/05/22 0906 (!) 103     Resp 01/05/22 0906 18     Temp 01/05/22 0906 98.4 F (36.9 C)     Temp Source 01/05/22 0906  Oral     SpO2 01/05/22 0906 99 %     Weight 01/05/22 0908 235 lb (106.6 kg)     Height 01/05/22 0908 5\' 7"  (1.702 m)     Head Circumference --      Peak Flow --      Pain Score 01/05/22 0905 8     Pain Loc --      Pain Edu? --      Excl. in [place]? --    No data found.  Updated Vital Signs BP (!) 146/100 (BP Location: Right Arm)    Pulse (!) 103    Temp 98.4 F (36.9 C) (Oral)    Resp 18    Ht 5\' 7"  (1.702 m)    Wt 235 lb (106.6 kg)    SpO2 99%    BMI 36.81 kg/m   Visual Acuity Right Eye Distance:   Left Eye Distance:   Bilateral Distance:    Right Eye Near:   Left Eye Near:    Bilateral Near:     Physical Exam Vitals and nursing note reviewed.  Constitutional:      Appearance: Normal appearance. He is not ill-appearing.  HENT:      Head: Normocephalic and atraumatic.  Musculoskeletal:        General: Swelling and tenderness present. No deformity or signs of injury.  Skin:    General: Skin is warm and dry.     Capillary Refill: Capillary refill takes less than 2 seconds.     Findings: No bruising, erythema or rash.  Neurological:     General: No focal deficit present.     Mental Status: He is alert.     Sensory: No sensory deficit.     Motor: Weakness present.  Psychiatric:        Mood and Affect: Mood normal.        Behavior: Behavior normal.        Thought Content: Thought content normal.        Judgment: Judgment normal.     UC Treatments / Results  Labs (all labs ordered are listed, but only abnormal results are displayed) Labs Reviewed - No data to display  EKG   Radiology DG Wrist Complete Left  Result Date: 01/05/2022 CLINICAL DATA:  Pain, injury about a month ago mammo [facility] EXAM: LEFT WRIST - COMPLETE 3+ VIEW COMPARISON:  None. FINDINGS: There is no evidence of fracture or dislocation. There is no evidence of arthropathy or other focal bone abnormality. Soft tissues are unremarkable. IMPRESSION: Negative. Electronically Signed   By: Larose Hires D.O.   On: 01/05/2022 09:40    Procedures Procedures (including critical care time)  Medications Ordered in UC Medications - No data to display  Initial Impression / Assessment and Plan / UC Course  I have reviewed the triage vital signs and the nursing notes.  Pertinent labs & imaging results that were available during my care of the patient were reviewed by me and considered in my medical decision making (see chart for details).  Patient is a very pleasant, nontoxic-appearing 41 year old male here for evaluation of left wrist pain that has been going on for last 2 days.  As described in the HPI above, patient has had a history of intermittent pain and swelling of the wrist over the past year.  He has never been evaluated until now.  He does complain  that his grip is decreased secondary to pain in the also is limited range of motion  of his wrist secondary to pain.  On exam patient's left wrist is in normal anatomical alignment but there is visible edema to the wrist.  No ecchymosis, or erythema noted.  The joint is mildly warm to touch.  It is also tender with palpation of the carpal bones and with compression of the radial ulnar styloid.  There is no tenderness with palpation of the metacarpals or phalanges.  Cap refills less than 2 seconds.  Patient does have limited flexion, extension, radial ulnar deviation, as well as supination of the wrist all secondary to pain.  Radiographs of the left wrist were obtained at triage.  Left wrist radiographs independently reviewed and evaluated by me.  Impression: No evidence of fracture or dislocation.  Soft tissues are unremarkable.  Radiology overread is pending. Radiology impression shows no evidence of fracture or dislocation.  No evidence of arthropathy or focal bone abnormality.  Soft tissues unremarkable.  Negative exam.  Suspect patient has some tendinopathy secondary to the job he performs.  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  I was unable to test for Phalen's test as he has markedly decreased range of motion of the wrist.  I have advised him that he needs to take his brace off several times of the day and perform range of motion exercises to help maintain mobility in his wrist.  I have given him home physical therapy exercises to perform.  Also advised him to apply moist heat to his wrist to improve blood flow and aid in healing.  I will place him on a prednisone pack to start today.  Have given work note to cover him for today tomorrow.  Advised him that if he does not have any improvement in his pain within 48 hours she should follow-up with orthopedics.   Final Clinical Impressions(s) / UC Diagnoses   Final diagnoses:  Left wrist pain     Discharge Instructions      Wear  your wrist splint to protect your wrist from further injury.  Take the Prednisone daily at breakfast for 6 days according to the package instruction.  Apply moist heat to your wrist to improve blood flow and aid in healing.  Perform the wrist exercises 1-2 times a day to help increase and maintain mobility.  If you do not have any improvement in yoru symptoms in 48 hours follow up with [facility] in [place] on [address].     ED Prescriptions     Medication Sig Dispense Auth. Provider   predniSONE (STERAPRED UNI-PAK 21 TAB) 10 MG (21) TBPK tablet Take 6 tablets on day 1, 5 tablets day 2, 4 tablets day 3, 3 tablets day 4, 2 tablets day 5, 1 tablet day 6 21 tablet Becky Augusta, NP      PDMP not reviewed this encounter.   Becky Augusta, NP 01/05/22 1009

## 2022-01-05 NOTE — Discharge Instructions (Signed)
Wear your wrist splint to protect your wrist from further injury.  Take the Prednisone daily at breakfast for 6 days according to the package instruction.  Apply moist heat to your wrist to improve blood flow and aid in healing.  Perform the wrist exercises 1-2 times a day to help increase and maintain mobility.  If you do not have any improvement in yoru symptoms in 48 hours follow up with [facility] in [place] on [address].

## 2022-01-05 NOTE — ED Triage Notes (Signed)
Patient is here for "left wrist pain', ? Injury about a month ago. Got better some since then, now still recurrent pain. No other concerns.

## 2022-05-15 IMAGING — CR DG WRIST COMPLETE 3+V*L*
5 series · 5 of 5 positions shown · non-contrast
Comparison: None.

CLINICAL DATA: Pain, injury about a month ago mammo [place]

EXAM:
LEFT WRIST - COMPLETE 3+ VIEW

[wrist pa]
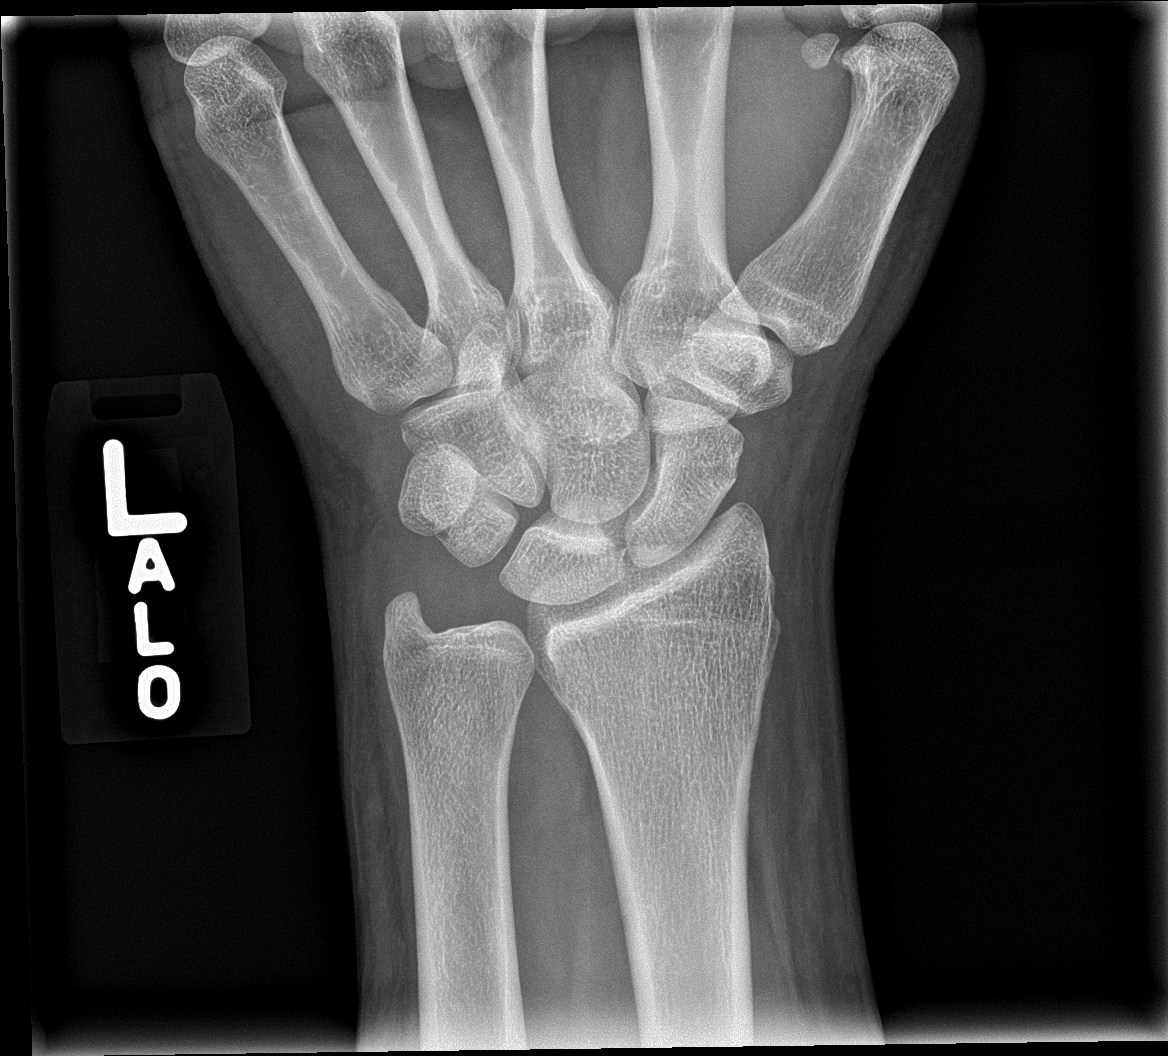

[wrist obl]
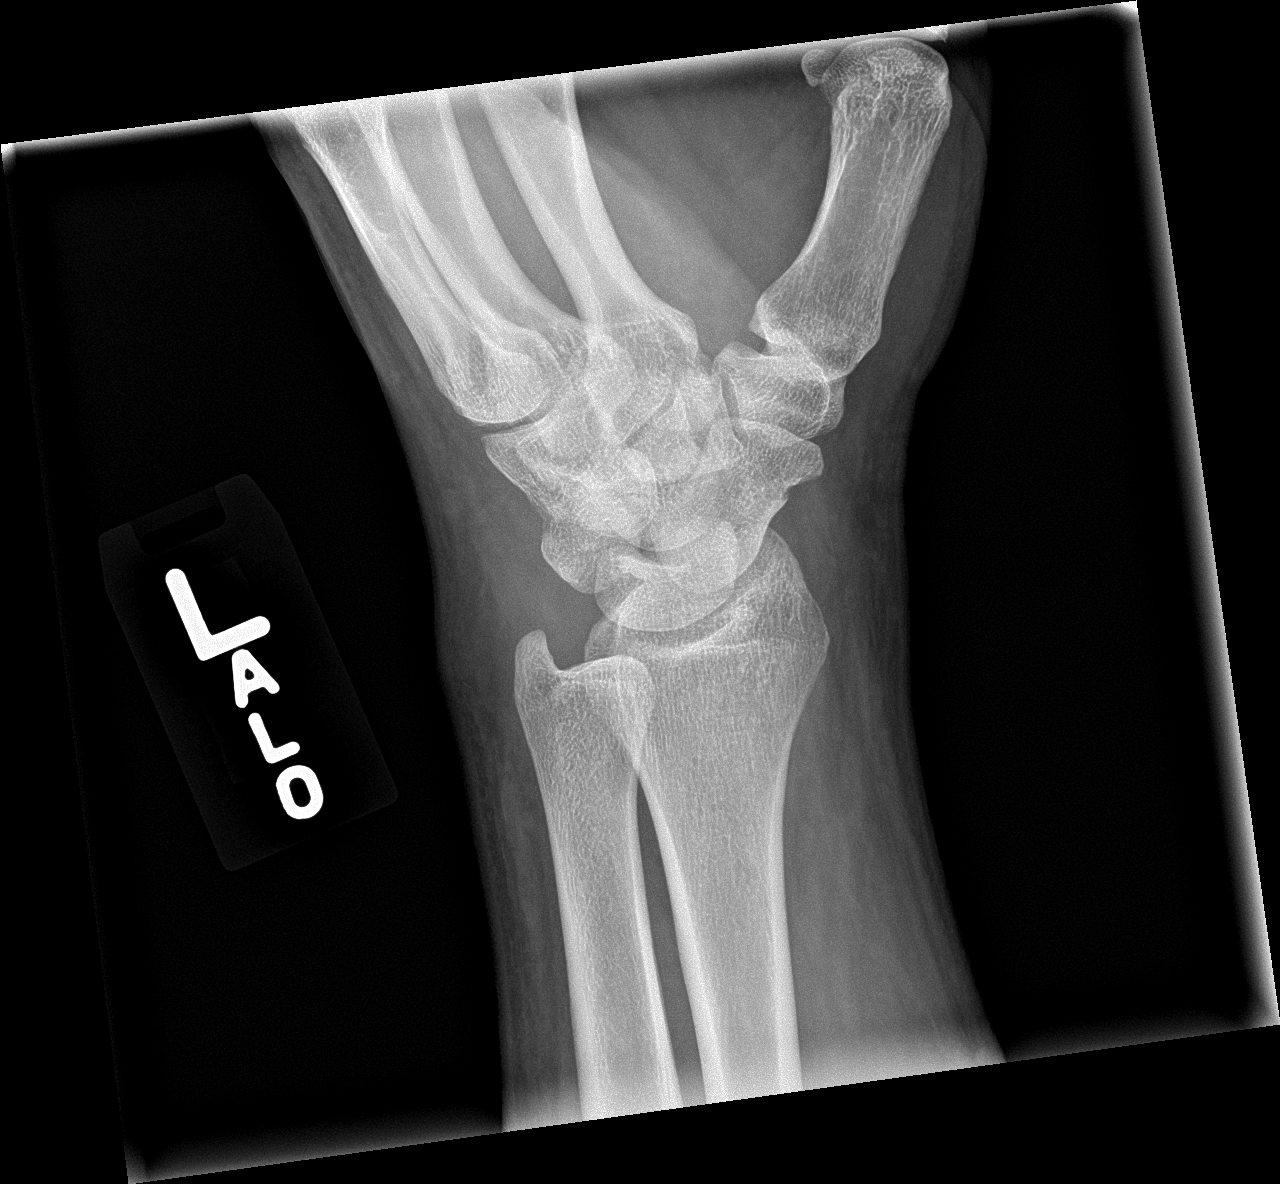

[wrist lat]
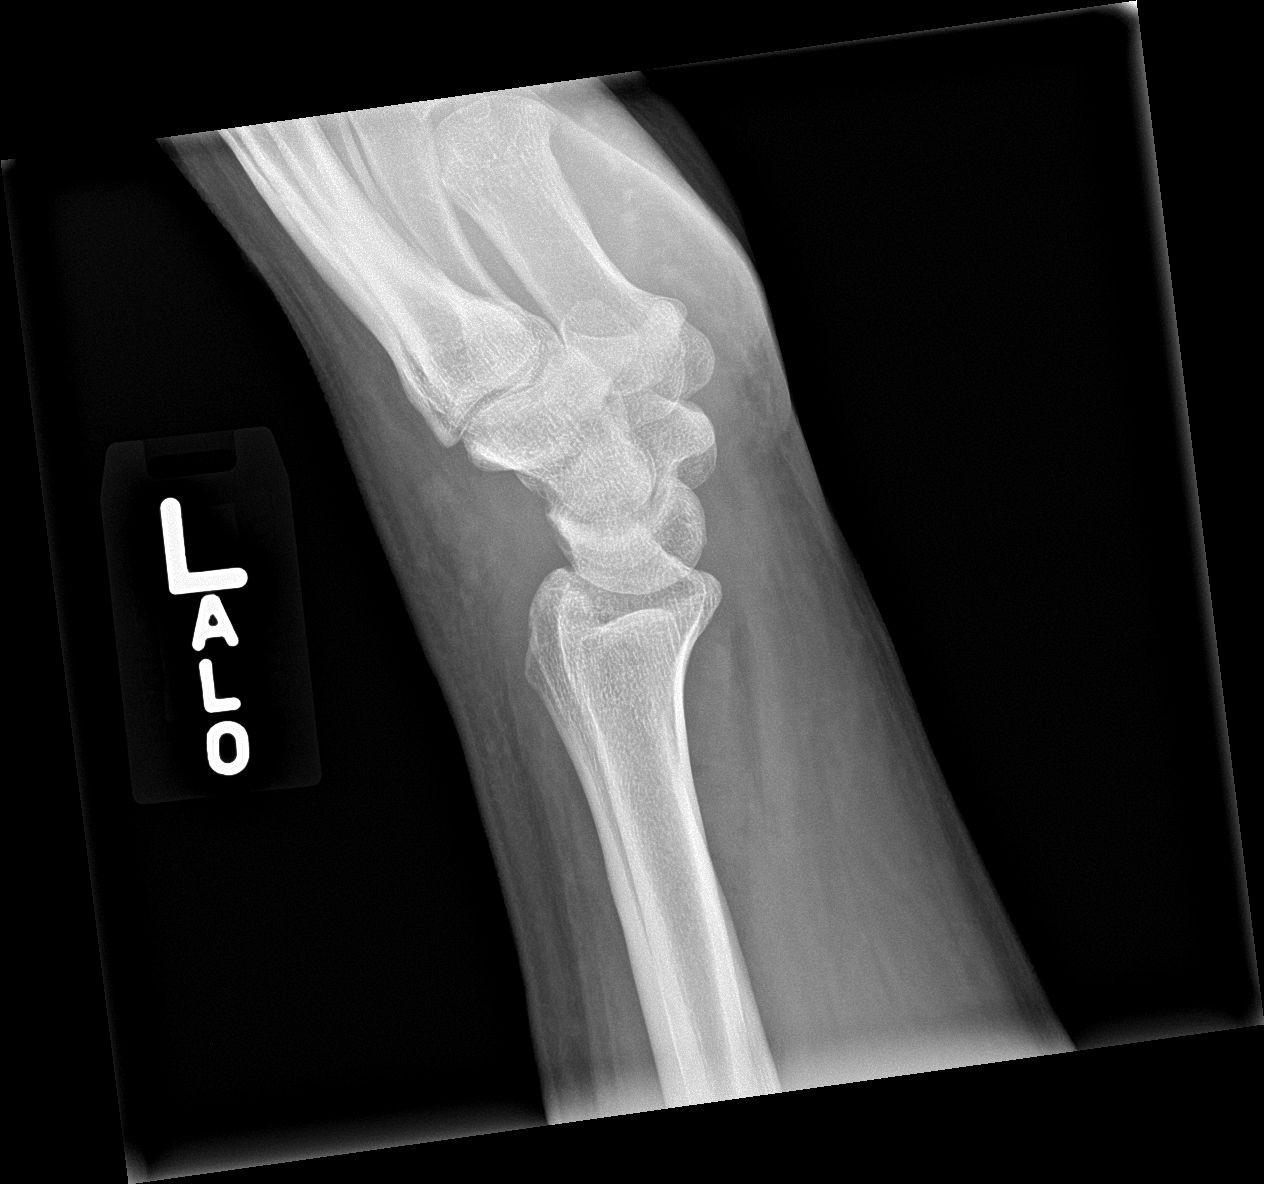

[wrist navicular (1 of 2)]
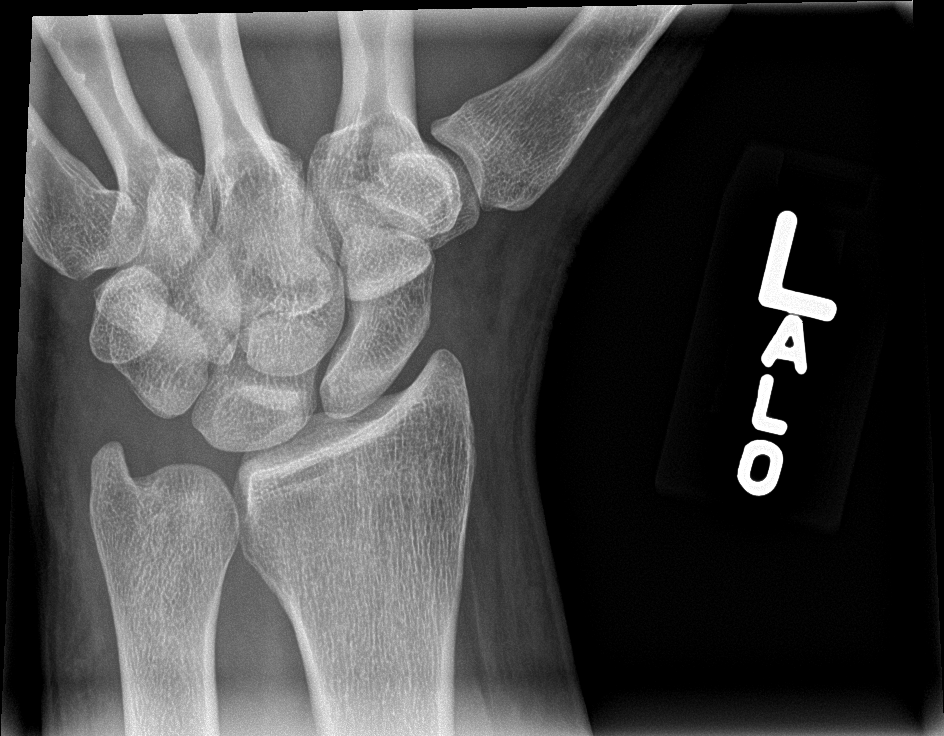

[wrist navicular (2 of 2)]
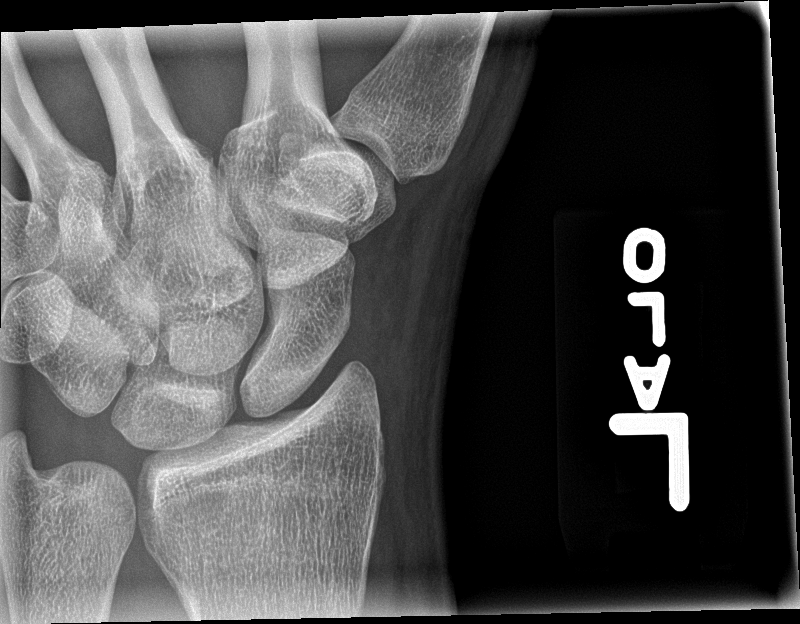

[5 of 5 positions shown; findings below may reference images not displayed]

FINDINGS: There is no evidence of fracture or dislocation. There is no
evidence of arthropathy or other focal bone abnormality. Soft
tissues are unremarkable.
IMPRESSION: Negative.

## 2023-09-13 ENCOUNTER — Other Ambulatory Visit: Payer: Self-pay

## 2023-09-13 ENCOUNTER — Emergency Department: Payer: Worker's Compensation

## 2023-09-13 ENCOUNTER — Emergency Department
Admission: EM | Admit: 2023-09-13 | Discharge: 2023-09-14 | Disposition: A | Discharge status: Home / Self Care | Payer: Worker's Compensation | Attending: Emergency Medicine | Admitting: Emergency Medicine

## 2023-09-13 DIAGNOSIS — W231XXA Caught, crushed, jammed, or pinched between stationary objects, initial encounter: Secondary | ICD-10-CM | POA: Diagnosis not present

## 2023-09-13 DIAGNOSIS — S6702XA Crushing injury of left thumb, initial encounter: Secondary | ICD-10-CM | POA: Diagnosis present

## 2023-09-13 DIAGNOSIS — S60112A Contusion of left thumb with damage to nail, initial encounter: Secondary | ICD-10-CM | POA: Diagnosis not present

## 2023-09-13 DIAGNOSIS — Z23 Encounter for immunization: Secondary | ICD-10-CM | POA: Diagnosis not present

## 2023-09-13 DIAGNOSIS — Y990 Civilian activity done for income or pay: Secondary | ICD-10-CM | POA: Diagnosis not present

## 2023-09-13 NOTE — ED Triage Notes (Addendum)
Pt to ED via POV c/o left thumb injury. Pt smashed left thumb in clamp at around 7pm. Pt having some bleeding around nail. Dressed with gauze. Pt wanting to file [facility]

## 2023-09-13 NOTE — ED Notes (Signed)
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

## 2023-09-14 MED ORDER — TETANUS-DIPHTH-ACELL PERTUSSIS 5-2.5-18.5 LF-MCG/0.5 IM SUSY
0.5000 mL | PREFILLED_SYRINGE | Freq: Once | INTRAMUSCULAR | Status: AC
Start: 2023-09-14 — End: 2023-09-14
  Administered 2023-09-14: 0.5 mL via INTRAMUSCULAR
  Filled 2023-09-14: qty 0.5

## 2023-09-14 NOTE — ED Provider Notes (Signed)
[facility] Provider Note    Event Date/Time   First MD Initiated Contact with Patient 09/13/23 2332     (approximate)   History   Finger Injury   HPI Shaun Heath is a 42 y.o. male who presents after an injury to his left thumb at work.  A piece of machinery smashed his left thumb and he has some bleeding and bruising beneath the thumbnail.  He is able to flex his thumb just fine and has no injury to his hand.  His last tetanus vaccination was more than 10 years ago.     Physical Exam   Triage Vital Signs: ED Triage Vitals  Encounter Vitals Group     BP 09/13/23 1928 (!) 156/113     Systolic BP Percentile --      Diastolic BP Percentile --      Pulse Rate 09/13/23 1928 81     Resp 09/13/23 1928 17     Temp 09/13/23 1928 98.5 F (36.9 C)     Temp Source 09/13/23 1928 Oral     SpO2 09/13/23 1928 98 %     Weight 09/13/23 1926 108.9 kg (240 lb)     Height 09/13/23 1926 1.727 m (5\' 8" )     Head Circumference --      Peak Flow --      Pain Score 09/13/23 1926 5     Pain Loc --      Pain Education --      Exclude from Growth Chart --     Most recent vital signs: Vitals:   09/13/23 1928  BP: (!) 156/113  Pulse: 81  Resp: 17  Temp: 98.5 F (36.9 C)  SpO2: 98%    General: Awake, no distress.  CV:  Good peripheral perfusion.  Resp:  Normal effort. Speaking easily and comfortably, no accessory muscle usage nor intercostal retractions.   Abd:  No distention.  Other:  Relatively mild crush injury to left thumb.  Fingernail is intact but he has bleeding around the side of the nail and the nail seems a little bit loose.  He has a subungual hematoma of more than 50% of the nail although it is bleeding from the side.  There is a small superficial laceration to the tip of the thumb that does not require repair.  Patient has full range of motion of the interphalangeal joint.   ED Results / Procedures / Treatments   Labs (all labs ordered  are listed, but only abnormal results are displayed) Labs Reviewed - No data to display    RADIOLOGY I viewed and interpreted the patient's thumb x-ray and I see no evidence of tuft fracture or other bony injury.   PROCEDURES:  Critical Care performed: No  ..Incision and Drainage  Date/Time: 09/14/2023 12:37 AM  Performed by: Loleta Rose, MD Authorized by: Loleta Rose, MD   Consent:    Consent obtained:  Verbal   Consent given by:  Patient   Alternatives discussed:  No treatment Universal protocol:    Patient identity confirmed:  Verbally with patient Location:    Type:  Subungual hematoma   Location:  Upper extremity   Upper extremity location:  Finger   Finger location:  L thumb Procedure type:    Complexity:  Simple Comments:     trephination with cautery tool, obtained appropriate drainage of hematoma     IMPRESSION / MDM / ASSESSMENT AND PLAN / ED COURSE  I reviewed the  triage vital signs and the nursing notes.                              Differential diagnosis includes, but is not limited to, fracture, dislocation, subungual hematoma, laceration, compartment syndrome.  Patient's presentation is most consistent with acute complicated illness / injury requiring diagnostic workup.  Labs/studies ordered: Thumb x-rays  Interventions/Medications given:  Medications  Tdap (BOOSTRIX) injection 0.5 mL (0.5 mLs Intramuscular Given 09/14/23 0040)    (Note:  hospital course my include additional interventions and/or labs/studies not listed above.)   Hypertensive, otherwise normal vital signs.  Pain generally well-controlled.  Some bleeding around the nail, no indication to remove the nail.  Trephination to facilitate drainage of subungual hematoma.  Provided aluminum foam splint if the patient wants to use it for comfort, but it is really just the tip of the thumb that is painful.  I gave my usual subungual hematoma discussion about management and follow-up as  an outpatient.  Patient can use the thumb as tolerated.         FINAL CLINICAL IMPRESSION(S) / ED DIAGNOSES   Final diagnoses:  Crush injury to thumb, left, initial encounter  Subungual hematoma of left thumb, initial encounter     Rx / DC Orders   ED Discharge Orders     None        Note:  This document was prepared using Dragon voice recognition software and may include unintentional dictation errors.   Loleta Rose, MD 09/14/23 [phone]

## 2023-09-14 NOTE — Discharge Instructions (Signed)
You have no broken bone in the thumb, but your thumb will be very sore for a while.  Please keep it clean and dry and use a thin layer of bacitracin or other antibiotic ointment to keep gauze from sticking to the wounds.  You can use the provided aluminum splint to keep the thumb protected if you want.  Read to the included information about subungual hematoma.  Use over-the-counter ibuprofen and/or Tylenol according to label instructions as needed for pain.  Follow-up with Dr. Stephenie Acres or another orthopedic specialist for follow-up appointment.
